# Patient Record
Sex: Male | Born: 1990 | Race: Black or African American | Hispanic: No | Marital: Single | Smoking: Never smoker
Health system: Southern US, Community
[De-identification: ages and names within clinical notes are randomized; demographics above are authoritative.]

## PROBLEM LIST (undated history)

## (undated) DIAGNOSIS — I341 Nonrheumatic mitral (valve) prolapse: Secondary | ICD-10-CM

## (undated) DIAGNOSIS — J45909 Unspecified asthma, uncomplicated: Secondary | ICD-10-CM

---

## 2010-08-24 ENCOUNTER — Emergency Department (HOSPITAL_COMMUNITY)
Admission: EM | Admit: 2010-08-24 | Discharge: 2010-08-24 | Disposition: A | Discharge status: Home / Self Care | Payer: 59 | Attending: Emergency Medicine | Admitting: Emergency Medicine

## 2010-08-24 DIAGNOSIS — S01119A Laceration without foreign body of unspecified eyelid and periocular area, initial encounter: Secondary | ICD-10-CM | POA: Insufficient documentation

## 2010-08-24 DIAGNOSIS — Y9367 Activity, basketball: Secondary | ICD-10-CM | POA: Insufficient documentation

## 2010-08-24 DIAGNOSIS — H5710 Ocular pain, unspecified eye: Secondary | ICD-10-CM | POA: Insufficient documentation

## 2010-08-24 DIAGNOSIS — W219XXA Striking against or struck by unspecified sports equipment, initial encounter: Secondary | ICD-10-CM | POA: Insufficient documentation

## 2010-08-24 DIAGNOSIS — M2560 Stiffness of unspecified joint, not elsewhere classified: Secondary | ICD-10-CM | POA: Insufficient documentation

## 2019-01-11 ENCOUNTER — Other Ambulatory Visit: Payer: Self-pay

## 2019-01-11 DIAGNOSIS — Z20822 Contact with and (suspected) exposure to covid-19: Secondary | ICD-10-CM

## 2019-01-12 LAB — NOVEL CORONAVIRUS, NAA: SARS-CoV-2, NAA: NOT DETECTED

## 2020-10-19 ENCOUNTER — Encounter (HOSPITAL_BASED_OUTPATIENT_CLINIC_OR_DEPARTMENT_OTHER): Payer: Self-pay | Admitting: Emergency Medicine

## 2020-10-19 ENCOUNTER — Emergency Department (HOSPITAL_BASED_OUTPATIENT_CLINIC_OR_DEPARTMENT_OTHER)
Admission: EM | Admit: 2020-10-19 | Discharge: 2020-10-19 | Disposition: A | Discharge status: Home / Self Care | Payer: Self-pay | Attending: Emergency Medicine | Admitting: Emergency Medicine

## 2020-10-19 ENCOUNTER — Other Ambulatory Visit: Payer: Self-pay

## 2020-10-19 ENCOUNTER — Emergency Department (HOSPITAL_BASED_OUTPATIENT_CLINIC_OR_DEPARTMENT_OTHER): Payer: Self-pay

## 2020-10-19 DIAGNOSIS — M25521 Pain in right elbow: Secondary | ICD-10-CM | POA: Insufficient documentation

## 2020-10-19 DIAGNOSIS — M7989 Other specified soft tissue disorders: Secondary | ICD-10-CM | POA: Insufficient documentation

## 2020-10-19 MED ORDER — NAPROXEN 500 MG PO TABS: 500.0000 mg | ORAL_TABLET | Freq: Two times a day (BID) | ORAL | 0 refills | Status: AC

## 2020-10-19 MED ORDER — OXYCODONE-ACETAMINOPHEN 5-325 MG PO TABS: 1.0000 | ORAL_TABLET | Freq: Four times a day (QID) | ORAL | 0 refills | Status: AC | PRN

## 2020-10-19 MED ORDER — LIDOCAINE 5 % EX PTCH: 1.0000 | MEDICATED_PATCH | CUTANEOUS | 0 refills | Status: AC

## 2020-10-19 NOTE — ED Notes (Signed)
Patient with swelling/pain to right elbow after bumping it this morning.

## 2020-10-19 NOTE — ED Triage Notes (Signed)
Reports pain in the right elbow since hitting it on a wall this morning.  Swelling noted.

## 2020-10-19 NOTE — ED Provider Notes (Signed)
[facility] EMERGENCY DEPARTMENT Provider Note   CSN: [id number] Arrival date & time: 10/19/20  1554     History Chief Complaint  Patient presents with   Elbow Pain    Steven Dickerson. is a 30 y.o. male here for evaluation of right elbow pain.  Hit his elbow on a glass panel earlier today.  Noted swelling to posterior aspect right elbow.  No overlying redness or warmth.  No paresthesias.  Pain worse with movement.  No bony tenderness to forearm, humerus.  No OTC meds PTA.  Pain a 6/10.  denies additional aggravating or alleviating factors.  History obtained from patient and past medical records.  No interpreter used  HPI     History reviewed. No pertinent past medical history.  There are no problems to display for this patient.   History reviewed. No pertinent surgical history.     No family history on file.     Home Medications Prior to Admission medications   Medication Sig Start Date End Date Taking? Authorizing Provider  lidocaine (LIDODERM) 5 % Place 1 patch onto the skin daily. Remove & Discard patch within 12 hours or as directed by MD 10/19/20  Yes Addilyn Satterwhite A, PA-C  naproxen (NAPROSYN) 500 MG tablet Take 1 tablet (500 mg total) by mouth 2 (two) times daily. 10/19/20  Yes Dewon Mendizabal A, PA-C  oxyCODONE-acetaminophen (PERCOCET/ROXICET) 5-325 MG tablet Take 1 tablet by mouth every 6 (six) hours as needed for severe pain. 10/19/20  Yes Green Quincy A, PA-C    Allergies    Patient has no allergy information on record.  Review of Systems   Review of Systems  Constitutional: Negative.   HENT: Negative.    Respiratory: Negative.    Cardiovascular: Negative.   Gastrointestinal: Negative.   Genitourinary: Negative.   Musculoskeletal:        Right elbow swelling, pain  Skin: Negative.   Neurological: Negative.   All other systems reviewed and are negative.  Physical Exam Updated Vital Signs BP (!) 120/98 (BP Location: Left Arm)    Pulse 63   Temp 98.4 F (36.9 C) (Oral)   Resp 18   Ht 6\' 3"  (1.905 m)   Wt 79.4 kg   SpO2 100%   BMI 21.87 kg/m   Physical Exam Vitals and nursing note reviewed.  Constitutional:      General: He is not in acute distress.    Appearance: He is well-developed. He is not ill-appearing, toxic-appearing or diaphoretic.  HENT:     Head: Normocephalic and atraumatic.  Eyes:     Pupils: Pupils are equal, round, and reactive to light.  Cardiovascular:     Rate and Rhythm: Normal rate and regular rhythm.  Pulmonary:     Effort: Pulmonary effort is normal. No respiratory distress.  Abdominal:     General: There is no distension.     Palpations: Abdomen is soft.  Musculoskeletal:        General: Swelling, tenderness and signs of injury present. Normal range of motion.     Cervical back: Normal range of motion and neck supple.     Comments: Tenderness with moderate soft tissue swelling to posterior aspect right olecranon.  No bony tenderness to humerus, forearm.  No redness, warmth.  Skin:    General: Skin is warm and dry.     Capillary Refill: Capillary refill takes less than 2 seconds.  Neurological:     General: No focal deficit present.  Mental Status: He is alert and oriented to person, place, and time.     Comments: Intact sensation    ED Results / Procedures / Treatments   Labs (all labs ordered are listed, but only abnormal results are displayed) Labs Reviewed - No data to display  EKG None  Radiology DG Elbow Complete Right  Result Date: 10/19/2020 CLINICAL DATA:  Pain after injury EXAM: RIGHT ELBOW - COMPLETE 3+ VIEW COMPARISON:  None. FINDINGS: Lateral view limited by positioning. No fracture or malalignment. No obvious elbow effusion. Soft tissue swelling over the posterior elbow IMPRESSION: Posterior soft tissue swelling.  No acute osseous abnormality Electronically Signed   By: Jasmine Pang M.D.   On: 10/19/2020 16:54    Procedures .Ortho Injury  Treatment  Date/Time: 10/19/2020 6:00 PM Performed by: Ralph Leyden A, PA-C Authorized by: Linwood Dibbles, PA-C   Consent:    Consent obtained:  Verbal   Consent given by:  Patient   Risks discussed:  Fracture, nerve damage, restricted joint movement, vascular damage, stiffness, recurrent dislocation and irreducible dislocation   Alternatives discussed:  No treatment, alternative treatment, immobilization, referral and delayed treatmentInjury location: elbow Location details: right elbow Injury type: soft tissue Pre-procedure neurovascular assessment: neurovascularly intact Pre-procedure distal perfusion: normal Pre-procedure neurological function: normal Pre-procedure range of motion: normal  Anesthesia: Local anesthesia used: no  Patient sedated: NoImmobilization: sling Splint Applied by: ED Tech Post-procedure neurovascular assessment: post-procedure neurovascularly intact Post-procedure distal perfusion: normal Post-procedure neurological function: normal Post-procedure range of motion: normal     Medications Ordered in ED Medications - No data to display  ED Course  I have reviewed the triage vital signs and the nursing notes.  Pertinent labs & imaging results that were available during my care of the patient were reviewed by me and considered in my medical decision making (see chart for details).  Here for evaluation of right elbow pain after hitting it on a glass window earlier today.  No lacerations or abrasions.  He is afebrile, nonseptic, not ill-appearing.  He is neurovascularly intact.  He has moderate posterior olecranon swelling however no redness or warmth.    X-ray shows no fracture or dislocation  Patient likely with traumatic bursitis  Low suspicion for septic joint, gout, hemarthrosis, VTE, ischemia, occult fracture.  Placed in sling for comfort, discussed anti-inflammatories, ice, follow with orthopedics if symptoms not resolved.  Patient  agreeable  The patient has been appropriately medically screened and/or stabilized in the ED. I have low suspicion for any other emergent medical condition which would require further screening, evaluation or treatment in the ED or require inpatient management.  Patient is hemodynamically stable and in no acute distress.  Patient able to ambulate in department prior to ED.  Evaluation does not show acute pathology that would require ongoing or additional emergent interventions while in the emergency department or further inpatient treatment.  I have discussed the diagnosis with the patient and answered all questions.  Pain is been managed while in the emergency department and patient has no further complaints prior to discharge.  Patient is comfortable with plan discussed in room and is stable for discharge at this time.  I have discussed strict return precautions for returning to the emergency department.  Patient was encouraged to follow-up with PCP/specialist refer to at discharge.     MDM Rules/Calculators/A&P  Final Clinical Impression(s) / ED Diagnoses Final diagnoses:  Right elbow pain    Rx / DC Orders ED Discharge Orders          Ordered    naproxen (NAPROSYN) 500 MG tablet  2 times daily        10/19/20 1758    oxyCODONE-acetaminophen (PERCOCET/ROXICET) 5-325 MG tablet  Every 6 hours PRN        10/19/20 1758    lidocaine (LIDODERM) 5 %  Every 24 hours        10/19/20 1758             Vona Whiters A, PA-C 10/19/20 1801    Arby Barrette, MD 11/17/20 [phone]

## 2020-10-19 NOTE — Discharge Instructions (Addendum)
Take the Percocet as needed for pain  Take the naproxen for anti-inflammatory purposes Use lidocaine patches on your elbow for pain management  Keep elbow in sling over the next 2 days.  After that she may use some gentle stretching exercises.  If symptoms do not improve With orthopedics.  I have listed on your discharge paperwork.  You will need to call to schedule an appointment

## 2022-02-20 ENCOUNTER — Other Ambulatory Visit: Payer: Self-pay

## 2022-02-20 ENCOUNTER — Encounter (HOSPITAL_BASED_OUTPATIENT_CLINIC_OR_DEPARTMENT_OTHER): Payer: Self-pay | Admitting: Urology

## 2022-02-20 ENCOUNTER — Emergency Department (HOSPITAL_BASED_OUTPATIENT_CLINIC_OR_DEPARTMENT_OTHER)
Admission: EM | Admit: 2022-02-20 | Discharge: 2022-02-20 | Disposition: A | Discharge status: Home / Self Care | Payer: Managed Care, Other (non HMO) | Attending: Emergency Medicine | Admitting: Emergency Medicine

## 2022-02-20 ENCOUNTER — Emergency Department (HOSPITAL_BASED_OUTPATIENT_CLINIC_OR_DEPARTMENT_OTHER): Payer: Managed Care, Other (non HMO)

## 2022-02-20 DIAGNOSIS — J4521 Mild intermittent asthma with (acute) exacerbation: Secondary | ICD-10-CM | POA: Insufficient documentation

## 2022-02-20 DIAGNOSIS — R0602 Shortness of breath: Secondary | ICD-10-CM | POA: Diagnosis present

## 2022-02-20 HISTORY — DX: Unspecified asthma, uncomplicated: J45.909

## 2022-02-20 HISTORY — DX: Nonrheumatic mitral (valve) prolapse: I34.1

## 2022-02-20 MED ORDER — PREDNISONE 50 MG PO TABS
60.0000 mg | ORAL_TABLET | Freq: Once | ORAL | Status: AC
  Administered 2022-02-20: 60 mg via ORAL
  Filled 2022-02-20: qty 1

## 2022-02-20 MED ORDER — ALBUTEROL SULFATE HFA 108 (90 BASE) MCG/ACT IN AERS
2.0000 | INHALATION_SPRAY | Freq: Once | RESPIRATORY_TRACT | Status: AC
  Administered 2022-02-20: 2 via RESPIRATORY_TRACT
  Filled 2022-02-20: qty 6.7

## 2022-02-20 MED ORDER — ALBUTEROL SULFATE HFA 108 (90 BASE) MCG/ACT IN AERS: 2.0000 | INHALATION_SPRAY | RESPIRATORY_TRACT | 0 refills | Status: AC | PRN

## 2022-02-20 MED ORDER — PREDNISONE 10 MG PO TABS: 40.0000 mg | ORAL_TABLET | Freq: Every day | ORAL | 0 refills | Status: AC

## 2022-02-20 NOTE — ED Provider Notes (Signed)
[facility] EMERGENCY DEPARTMENT Provider Note   CSN: [id number] Arrival date & time: 02/20/22  1845     History  Chief Complaint  Patient presents with   Shortness of Breath    Steven Dethomas. is a 31 y.o. male.  Patient is a 31 year old male with a past medical history of asthma presenting to the emergency department with shortness of breath.  Patient states that he tends to have allergy symptoms with weather changes and over the last few days with the weather changing from the 60s to the 40s he has had increasing shortness of breath.  He denies any chest pain or cough, fevers or chills, nausea or vomiting.  He denies any lower extremity swelling.  He states that he has not needed an inhaler in several years and so did not have 1 at home to take for his symptoms.  He states that he has been taking his allergy medications without significant relief.  The history is provided by the patient.  Shortness of Breath      Home Medications Prior to Admission medications   Medication Sig Start Date End Date Taking? Authorizing Provider  albuterol (VENTOLIN HFA) 108 (90 Base) MCG/ACT inhaler Inhale 2 puffs into the lungs every 4 (four) hours as needed for wheezing or shortness of breath. 02/20/22  Yes Maylon Peppers, [place] K, DO  predniSONE (DELTASONE) 10 MG tablet Take 4 tablets (40 mg total) by mouth daily for 4 days. 02/20/22 02/24/22 Yes Kingsley, Jordan Hawks K, DO  lidocaine (LIDODERM) 5 % Place 1 patch onto the skin daily. Remove & Discard patch within 12 hours or as directed by MD 10/19/20   Henderly, Britni A, PA-C  naproxen (NAPROSYN) 500 MG tablet Take 1 tablet (500 mg total) by mouth 2 (two) times daily. 10/19/20   Henderly, Britni A, PA-C  oxyCODONE-acetaminophen (PERCOCET/ROXICET) 5-325 MG tablet Take 1 tablet by mouth every 6 (six) hours as needed for severe pain. 10/19/20   Henderly, Britni A, PA-C      Allergies    Patient has no known allergies.    Review of Systems    Review of Systems  Respiratory:  Positive for shortness of breath.     Physical Exam Updated Vital Signs BP (!) 136/95   Pulse 74   Temp 98 F (36.7 C) (Oral)   Resp 16   Ht 6\' 3"  (1.905 m)   Wt 79.4 kg   SpO2 95%   BMI 21.88 kg/m  Physical Exam Vitals and nursing note reviewed.  Constitutional:      General: He is not in acute distress.    Appearance: He is well-developed.  HENT:     Head: Normocephalic and atraumatic.     Mouth/Throat:     Mouth: Mucous membranes are moist.     Pharynx: Oropharynx is clear.  Eyes:     Extraocular Movements: Extraocular movements intact.  Cardiovascular:     Rate and Rhythm: Normal rate and regular rhythm.     Heart sounds: Normal heart sounds.  Pulmonary:     Effort: Pulmonary effort is normal.     Breath sounds: Normal breath sounds. Wheezes: Trace inspiratory and expiratory in diffuse lung fields.  Abdominal:     Palpations: Abdomen is soft.     Tenderness: There is no abdominal tenderness.  Musculoskeletal:        General: Normal range of motion.     Cervical back: Normal range of motion and neck supple.  Skin:  General: Skin is warm and dry.  Neurological:     General: No focal deficit present.     Mental Status: He is alert and oriented to person, place, and time.  Psychiatric:        Mood and Affect: Mood normal.        Behavior: Behavior normal.     ED Results / Procedures / Treatments   Labs (all labs ordered are listed, but only abnormal results are displayed) Labs Reviewed - No data to display  EKG None  Radiology DG Chest 2 View  Result Date: 02/20/2022 CLINICAL DATA:  Shortness of breath EXAM: CHEST - 2 VIEW COMPARISON:  Chest x-ray 08/13/2012 FINDINGS: The heart size and mediastinal contours are within normal limits. Both lungs are clear. The visualized skeletal structures are unremarkable. IMPRESSION: No active cardiopulmonary disease. Electronically Signed   By: Ronney Asters M.D.   On: 02/20/2022  19:04    Procedures Procedures    Medications Ordered in ED Medications  albuterol (VENTOLIN HFA) 108 (90 Base) MCG/ACT inhaler 2 puff (2 puffs Inhalation Given 02/20/22 2003)  predniSONE (DELTASONE) tablet 60 mg (60 mg Oral Given 02/20/22 2000)    ED Course/ Medical Decision Making/ A&P                           Medical Decision Making This patient presents to the ED with chief complaint(s) of shortness of breath with pertinent past medical history of asthma which further complicates the presenting complaint. The complaint involves an extensive differential diagnosis and also carries with it a high risk of complications and morbidity.    The differential diagnosis includes patient does have some mild wheezing on exam, consistent with asthma exacerbation, he has had no fever or cough making pneumonia or viral infection less likely, he has no evidence of respiratory distress or failure  Additional history obtained: Additional history obtained from N/A Records reviewed N/A  ED Course and Reassessment: Upon patient's arrival to the emergency department he has some mild inspiratory and expiratory wheeze on exam and will be treated with albuterol and prednisone.  Chest x-ray was performed that showed no acute disease.  He had improvement of his symptoms after treatment and is stable for discharge home with primary care follow-up.  He was given strict return precautions.  Independent labs interpretation:  N/A  Independent visualization of imaging: - I independently visualized the following imaging with scope of interpretation limited to determining acute life threatening conditions related to emergency care: CXR, which revealed No acute disease  Consultation: - Consulted or discussed management/test interpretation w/ external professional: N/A  Consideration for admission or further workup: Patient has no emergent conditions requiring admission or further work-up at this time and is  stable for discharge home with primary care follow-up  Social Determinants of health: N/A    Amount and/or Complexity of Data Reviewed Radiology: ordered.  Risk Prescription drug management.          Final Clinical Impression(s) / ED Diagnoses Final diagnoses:  Mild intermittent asthma with exacerbation    Rx / DC Orders ED Discharge Orders          Ordered    predniSONE (DELTASONE) 10 MG tablet  Daily        02/20/22 2128    albuterol (VENTOLIN HFA) 108 (90 Base) MCG/ACT inhaler  Every 4 hours PRN        02/20/22 2128  Rexford Maus, DO 02/20/22 2135

## 2022-02-20 NOTE — Discharge Instructions (Signed)
You are seen in the emergency department for your shortness of breath, you had some mild wheezing on your exam consistent with an asthma exacerbation.  Your chest x-ray showed no signs of pneumonia.  You have given you steroids and albuterol inhaler.  You should take the steroids as prescribed over the next 4 days and use the albuterol as needed.  He can follow-up with your primary doctor to have your symptoms rechecked.  You should return to the emergency department for worsening shortness of breath, chest pain or if you have any other new or concerning symptoms.

## 2022-02-20 NOTE — ED Triage Notes (Signed)
Pt states has been sob x past 3 days H/o asthma and states with the warmer weather has been feeling more short of breath  Denies cough, denies fever

## 2022-03-18 ENCOUNTER — Ambulatory Visit: Payer: Managed Care, Other (non HMO) | Admitting: Family Medicine

## 2022-03-18 ENCOUNTER — Encounter: Payer: Self-pay | Admitting: Family Medicine

## 2022-03-18 VITALS — BP 135/87 | HR 56 | Temp 97.2°F | Ht 75.0 in | Wt 169.0 lb

## 2022-03-18 DIAGNOSIS — I341 Nonrheumatic mitral (valve) prolapse: Secondary | ICD-10-CM

## 2022-03-18 DIAGNOSIS — J4521 Mild intermittent asthma with (acute) exacerbation: Secondary | ICD-10-CM

## 2022-03-18 NOTE — Progress Notes (Unsigned)
   New Patient Office Visit  Subjective    Patient ID: Steven Lazaro., male    DOB: 1990-06-21  Age: 32 y.o. MRN: [id number]  CC:  Chief Complaint  Patient presents with   Asthma    New pt. Seen in ER for Asthmatic Bronchitis, needs work restriction note.    HPI Steven Dickerson. presents to establish care ***  Outpatient Encounter Medications as of 03/18/2022  Medication Sig   albuterol (VENTOLIN HFA) 108 (90 Base) MCG/ACT inhaler Inhale 2 puffs into the lungs every 4 (four) hours as needed for wheezing or shortness of breath.   lidocaine (LIDODERM) 5 % Place 1 patch onto the skin daily. Remove & Discard patch within 12 hours or as directed by MD   naproxen (NAPROSYN) 500 MG tablet Take 1 tablet (500 mg total) by mouth 2 (two) times daily.   oxyCODONE-acetaminophen (PERCOCET/ROXICET) 5-325 MG tablet Take 1 tablet by mouth every 6 (six) hours as needed for severe pain.   No facility-administered encounter medications on file as of 03/18/2022.    Past Medical History:  Diagnosis Date   Asthma    Mitral valve prolapse     History reviewed. No pertinent surgical history.  History reviewed. No pertinent family history.  Social History   Socioeconomic History   Marital status: Single    Spouse name: Not on file   Number of children: Not on file   Years of education: Not on file   Highest education level: Not on file  Occupational History   Not on file  Tobacco Use   Smoking status: Never   Smokeless tobacco: Never  Substance and Sexual Activity   Alcohol use: Yes    Comment: rarely   Drug use: Never   Sexual activity: Not on file  Other Topics Concern   Not on file  Social History Narrative   Not on file   Social Determinants of Health   Financial Resource Strain: Not on file  Food Insecurity: Not on file  Transportation Needs: Not on file  Physical Activity: Not on file  Stress: Not on file  Social Connections: Not on file  Intimate Partner Violence:  Not on file    ROS      Objective    BP 135/87   Pulse (!) 56   Temp (!) 97.2 F (36.2 C)   Ht 6\' 3"  (1.905 m)   Wt 169 lb (76.7 kg)   SpO2 95%   BMI 21.12 kg/m   Physical Exam  {Labs (Optional):23779}    Assessment & Plan:   Problem List Items Addressed This Visit   None Visit Diagnoses     Mild intermittent asthma with acute exacerbation    -  Primary   Mitral valve prolapse           Return in about 2 weeks (around 04/01/2022) for PFTS.   Elwin Mocha, MD

## 2022-03-18 NOTE — Progress Notes (Unsigned)
Pt. Was seen in ER and given Albuterol Inhaler and Prednisone. Pt. Needs a work restriction note so he does not have to go into freezer at work.

## 2022-04-01 ENCOUNTER — Other Ambulatory Visit: Payer: Managed Care, Other (non HMO) | Admitting: Family Medicine

## 2022-04-01 ENCOUNTER — Ambulatory Visit: Payer: Managed Care, Other (non HMO)

## 2022-07-09 ENCOUNTER — Emergency Department (HOSPITAL_COMMUNITY): Payer: Managed Care, Other (non HMO)

## 2022-07-09 ENCOUNTER — Other Ambulatory Visit: Payer: Self-pay

## 2022-07-09 ENCOUNTER — Ambulatory Visit: Payer: Managed Care, Other (non HMO) | Admitting: Family Medicine

## 2022-07-09 ENCOUNTER — Emergency Department (HOSPITAL_COMMUNITY)
Admission: EM | Admit: 2022-07-09 | Discharge: 2022-07-09 | Disposition: A | Discharge status: Home / Self Care | Payer: Managed Care, Other (non HMO) | Attending: Emergency Medicine | Admitting: Emergency Medicine

## 2022-07-09 ENCOUNTER — Encounter (HOSPITAL_COMMUNITY): Payer: Self-pay | Admitting: Emergency Medicine

## 2022-07-09 DIAGNOSIS — H9202 Otalgia, left ear: Secondary | ICD-10-CM | POA: Diagnosis present

## 2022-07-09 DIAGNOSIS — R6884 Jaw pain: Secondary | ICD-10-CM | POA: Diagnosis not present

## 2022-07-09 DIAGNOSIS — I890 Lymphedema, not elsewhere classified: Secondary | ICD-10-CM | POA: Insufficient documentation

## 2022-07-09 DIAGNOSIS — R0602 Shortness of breath: Secondary | ICD-10-CM | POA: Diagnosis not present

## 2022-07-09 DIAGNOSIS — J45909 Unspecified asthma, uncomplicated: Secondary | ICD-10-CM | POA: Diagnosis not present

## 2022-07-09 LAB — BASIC METABOLIC PANEL
Anion gap: 10 (ref 5–15)
BUN: 7 mg/dL (ref 6–20)
CO2: 26 mmol/L (ref 22–32)
Calcium: 9.2 mg/dL (ref 8.9–10.3)
Chloride: 99 mmol/L (ref 98–111)
Creatinine, Ser: 0.99 mg/dL (ref 0.61–1.24)
GFR, Estimated: >60 mL/min (ref >60)
Glucose, Bld: 101 mg/dL — ABNORMAL HIGH (ref 70–99)
Potassium: 3.9 mmol/L (ref 3.5–5.1)
Sodium: 135 mmol/L (ref 135–145)

## 2022-07-09 LAB — CBC WITH DIFFERENTIAL/PLATELET
Abs Immature Granulocytes: 0.02 10*3/uL (ref 0.00–0.07)
Basophils Absolute: 0.1 10*3/uL (ref 0.0–0.1)
Basophils Relative: 1 %
Eosinophils Absolute: 0.7 10*3/uL — ABNORMAL HIGH (ref 0.0–0.5)
Eosinophils Relative: 10 %
HCT: 43.7 % (ref 39.0–52.0)
Hemoglobin: 14.6 g/dL (ref 13.0–17.0)
Immature Granulocytes: 0 %
Lymphocytes Relative: 21 %
Lymphs Abs: 1.6 10*3/uL (ref 0.7–4.0)
MCH: 30.4 pg (ref 26.0–34.0)
MCHC: 33.4 g/dL (ref 30.0–36.0)
MCV: 91 fL (ref 80.0–100.0)
Monocytes Absolute: 0.8 10*3/uL (ref 0.1–1.0)
Monocytes Relative: 10 %
Neutro Abs: 4.5 10*3/uL (ref 1.7–7.7)
Neutrophils Relative %: 58 %
Platelets: 341 10*3/uL (ref 150–400)
RBC: 4.8 MIL/uL (ref 4.22–5.81)
RDW: 13.1 % (ref 11.5–15.5)
WBC: 7.7 10*3/uL (ref 4.0–10.5)
nRBC: 0 % (ref 0.0–0.2)

## 2022-07-09 NOTE — ED Triage Notes (Addendum)
Pt c/o left ear fullness and pain that goes to jaw and left neck after laying on his side sleeping. Pt also reports bump on left neck/shoulder that he noticed tonight. Reports he has been struggling with allergies and congestion recently.

## 2022-07-09 NOTE — ED Provider Notes (Signed)
[facility] AT [facility] Provider Note   CSN: [id number] Arrival date & time: 07/09/22  4098     History  Chief Complaint  Patient presents with   Otalgia    Steven All. is a 32 y.o. male.  HPI  Patient with medical history including asthma, mitral prolapse, presenting with complaints of left-sided ear pain and jaw pain which started yesterday, states that having difficulty with his allergies, has continuous nasal congestion, states that he laid down to go to bed and then woke up with pain in his left ear, states he feels some fullness and then feels pain in his left jaw, denies any change in hearing, denies any tongue throat lip swelling.  States he is have some difficulty breathing but this has been going on for last 4 months, states it remained unchanged, denies any pleuritic chest pain, recurrent chest pain at this time, no history of PEs or DVTs, no cardiac history, no recent surgeries no long mobilization, no family history of connective tissue disorder, autoimmune diseases.    Home Medications Prior to Admission medications   Medication Sig Start Date End Date Taking? Authorizing Provider  albuterol (VENTOLIN HFA) 108 (90 Base) MCG/ACT inhaler Inhale 2 puffs into the lungs every 4 (four) hours as needed for wheezing or shortness of breath. 02/20/22   Elayne Snare K, DO  lidocaine (LIDODERM) 5 % Place 1 patch onto the skin daily. Remove & Discard patch within 12 hours or as directed by MD 10/19/20   Henderly, Britni A, PA-C  naproxen (NAPROSYN) 500 MG tablet Take 1 tablet (500 mg total) by mouth 2 (two) times daily. 10/19/20   Henderly, Britni A, PA-C  oxyCODONE-acetaminophen (PERCOCET/ROXICET) 5-325 MG tablet Take 1 tablet by mouth every 6 (six) hours as needed for severe pain. 10/19/20   Henderly, Britni A, PA-C      Allergies    Patient has no known allergies.    Review of Systems   Review of Systems  Constitutional:  Negative for  chills and fever.  HENT:  Positive for congestion.   Respiratory:  Negative for shortness of breath.   Cardiovascular:  Negative for chest pain.  Gastrointestinal:  Negative for abdominal pain.  Neurological:  Negative for headaches.    Physical Exam Updated Vital Signs BP (!) 142/91 (BP Location: Right Arm)   Pulse 65   Temp 98.3 F (36.8 C)   Resp 16   Ht 6\' 3"  (1.905 m)   Wt 76.7 kg   SpO2 99%   BMI 21.14 kg/m  Physical Exam Vitals and nursing note reviewed.  Constitutional:      General: He is not in acute distress.    Appearance: He is not ill-appearing.  HENT:     Head: Normocephalic and atraumatic.     Right Ear: Tympanic membrane, ear canal and external ear normal.     Left Ear: Tympanic membrane, ear canal and external ear normal.     Ears:     Comments: No ear protrusion, no tenderness behind the mastoids, left ear canal was patent, no evidence of infection, TM intact, slightly retracted without effusions present, right ear canal was patent, no evidence of infection, TM intact, nonbulging, no erythema, no effusions present    Nose: Congestion present.     Mouth/Throat:     Mouth: Mucous membranes are moist.     Pharynx: Oropharynx is clear. No oropharyngeal exudate or posterior oropharyngeal erythema.     Comments:  No trismus no torticollis, tongue uvula both midline, controlling oral secretions, tonsils are both equal symmetric bilaterally, no submandibular swelling, no obvious dental cavity or evidence of gingivitis.  No muffled tone voice Eyes:     Conjunctiva/sclera: Conjunctivae normal.  Neck:     Comments: Palpable left subclavian lymph node present, no other lymph nodes present on patient's neck. Cardiovascular:     Rate and Rhythm: Normal rate and regular rhythm.     Pulses: Normal pulses.     Heart sounds: No murmur heard.    No friction rub. No gallop.  Pulmonary:     Effort: No respiratory distress.     Breath sounds: No wheezing, rhonchi or rales.   Musculoskeletal:     Right lower leg: No edema.     Left lower leg: No edema.     Comments: No leg swelling no calf tenderness no palpable cords.  Skin:    General: Skin is warm and dry.  Neurological:     Mental Status: He is alert.  Psychiatric:        Mood and Affect: Mood normal.     ED Results / Procedures / Treatments   Labs (all labs ordered are listed, but only abnormal results are displayed) Labs Reviewed  BASIC METABOLIC PANEL - Abnormal; Notable for the following components:      Result Value   Glucose, Bld 101 (*)    All other components within normal limits  CBC WITH DIFFERENTIAL/PLATELET - Abnormal; Notable for the following components:   Eosinophils Absolute 0.7 (*)    All other components within normal limits    EKG None  Radiology DG Chest Portable 1 View  Result Date: 07/09/2022 CLINICAL DATA:  Cough.  Chest pain. EXAM: PORTABLE CHEST 1 VIEW COMPARISON:  02/20/2022 FINDINGS: The heart size and mediastinal contours are within normal limits. Both lungs are clear. The visualized skeletal structures are unremarkable. IMPRESSION: No active disease. Electronically Signed   By: Kennith Center M.D.   On: 07/09/2022 05:34    Procedures Procedures    Medications Ordered in ED Medications - No data to display  ED Course/ Medical Decision Making/ A&P                             Medical Decision Making Amount and/or Complexity of Data Reviewed Labs: ordered. Radiology: ordered.   This patient presents to the ED for concern of left ear and neck pain, this involves an extensive number of treatment options, and is a complaint that carries with it a high risk of complications and morbidity.  The differential diagnosis includes otitis media, otitis externa, retropharyngeal abscess    Additional history obtained:  Additional history obtained from N/A External records from outside source obtained and reviewed including recent ER notes   Co morbidities that  complicate the patient evaluation  Asthma  Social Determinants of Health:  N/A    Lab Tests:  I Ordered, and personally interpreted labs.  The pertinent results include: CBC unremarkable, BMP shows glucose of 101   Imaging Studies ordered:  I ordered imaging studies including chest x-ray I independently visualized and interpreted imaging which showed acute findings I agree with the radiologist interpretation   Cardiac Monitoring:  The patient was maintained on a cardiac monitor.  I personally viewed and interpreted the cardiac monitored which showed an underlying rhythm of: EKG without signs of ischemia   Medicines ordered and prescription drug management:  I ordered medication including N/A I have reviewed the patients home medicines and have made adjustments as needed  Critical Interventions:  N/A   Reevaluation:  Presents with concerns of left neck and ear pain, my exam patient had retracted left TM, patient is nasal congestion, palpable left subclavian lymph node, I suspect viral etiology as well as allergies, but will obtain basic lab work to rule out possible lymphoma as a chest x-ray due to a cough.  Patient was reassessed resting comfortably agreeable discharge at this time.  Consultations Obtained:  N/a    Test Considered:  N/a    Rule out Low suspicion for systemic infection as patient is nontoxic-appearing, vital signs reassuring, no obvious source infection noted on exam.  Low suspicion for pneumonia as lung sounds are clear bilaterally, x-ray did not reveal any acute findings.  I have low suspicion for PE as patient denies pleuritic chest pain, shortness of breath, patient is PERC.  Suspicion for ACS is also low at this time she has no cardiac history, EKG without signs of ischemia.  I doubt carotid dissection as presentation atypical, patient has no risk factors, presentation appears to be more consistent with likely eustachian tube dysfunction  from allergies/URI. low suspicion for strep pharyngitis, Ludwig angina, retropharyngeal/peritonsillar abscess as oropharynx was visualized, no erythema or exudates noted, no submandibular swelling, no muffled tone voice.  Suspicion for lymphoma is also low at this time CBC is within normal limits, did not feel other enlarged lymph nodes during my examination.  Doubt otitis media or externa no evidence of infection during my examination    Dispostion and problem list  After consideration of the diagnostic results and the patients response to treatment, I feel that the patent would benefit from discharge.  Left ear pain-suspect eustachian tube dysfunction from allergies, will have him continue with Zyrtec's, will add on Flonase, have him follow-up with ENT for further evaluation given strict return precautions. Lymphedema-enlarged left cervical lymph node, patient was made aware this, suspect this is reactionary from URI, will have him follow-up with his PCP for further assessment            Final Clinical Impression(s) / ED Diagnoses Final diagnoses:  Left ear pain  Lymphedema    Rx / DC Orders ED Discharge Orders     None         Carroll Sage, PA-C 07/09/22 1610    Marily Memos, MD 07/09/22 [phone]

## 2022-07-09 NOTE — Discharge Instructions (Signed)
Likely your ear pain is from eustachian tube dysfunction, please continue with Zyrtec and/or Claritin, I recommend adding on Flonase this will help with congestion.  I recommend follow-up with ENT for further evaluation given contact number for further evaluation Lymphedema-likely the lymph node in your neck is reactionary from the allergies, I recommend follow-up with your primary doctor for further evaluation.  Come back to the emergency department if you develop chest pain, shortness of breath, severe abdominal pain, uncontrolled nausea, vomiting, diarrhea.

## 2022-09-24 ENCOUNTER — Other Ambulatory Visit: Payer: Self-pay | Admitting: Family Medicine

## 2022-09-24 DIAGNOSIS — R0600 Dyspnea, unspecified: Secondary | ICD-10-CM

## 2022-09-24 DIAGNOSIS — R079 Chest pain, unspecified: Secondary | ICD-10-CM

## 2022-10-20 ENCOUNTER — Ambulatory Visit: Payer: Managed Care, Other (non HMO)

## 2022-10-20 ENCOUNTER — Other Ambulatory Visit: Payer: Managed Care, Other (non HMO)

## 2022-10-20 DIAGNOSIS — R079 Chest pain, unspecified: Secondary | ICD-10-CM

## 2022-10-20 DIAGNOSIS — R0600 Dyspnea, unspecified: Secondary | ICD-10-CM

## 2022-10-21 NOTE — Progress Notes (Signed)
Echocardiogram 10/20/2022: Left ventricle cavity is normal in size. Normal left ventricular wall thickness. Normal global wall motion. Normal LV systolic function with visual EF 50-55%. Normal diastolic filling pattern.  Mild tricuspid regurgitation.  No evidence of pulmonary hypertension.

## 2022-12-17 IMAGING — DX DG ELBOW COMPLETE 3+V*R*
4 series · 4 of 4 positions shown · non-contrast
Comparison: None.

CLINICAL DATA: Pain after injury

EXAM:
RIGHT ELBOW - COMPLETE 3+ VIEW

[elbow ap]
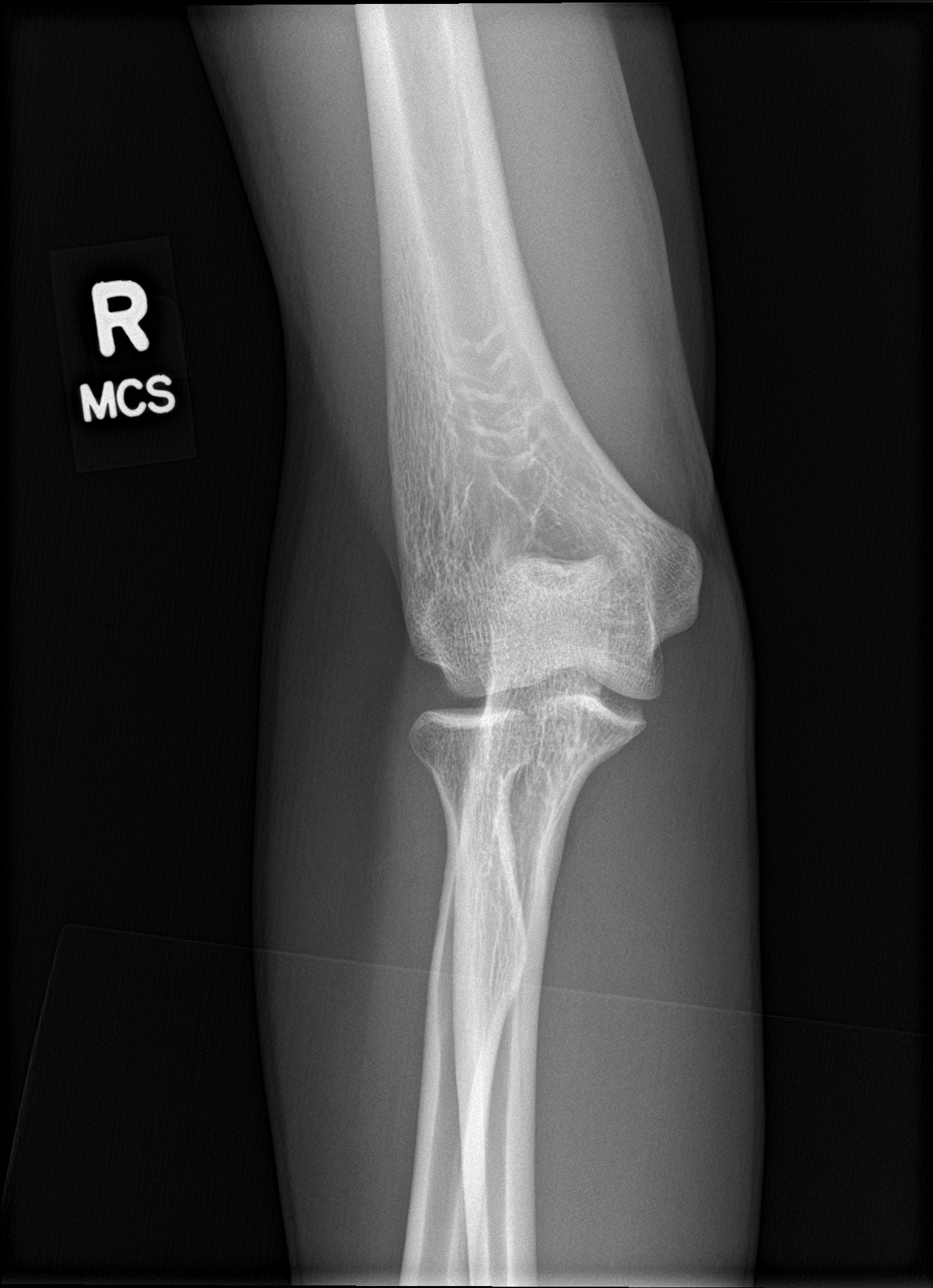

[elbow obl (1 of 2)]
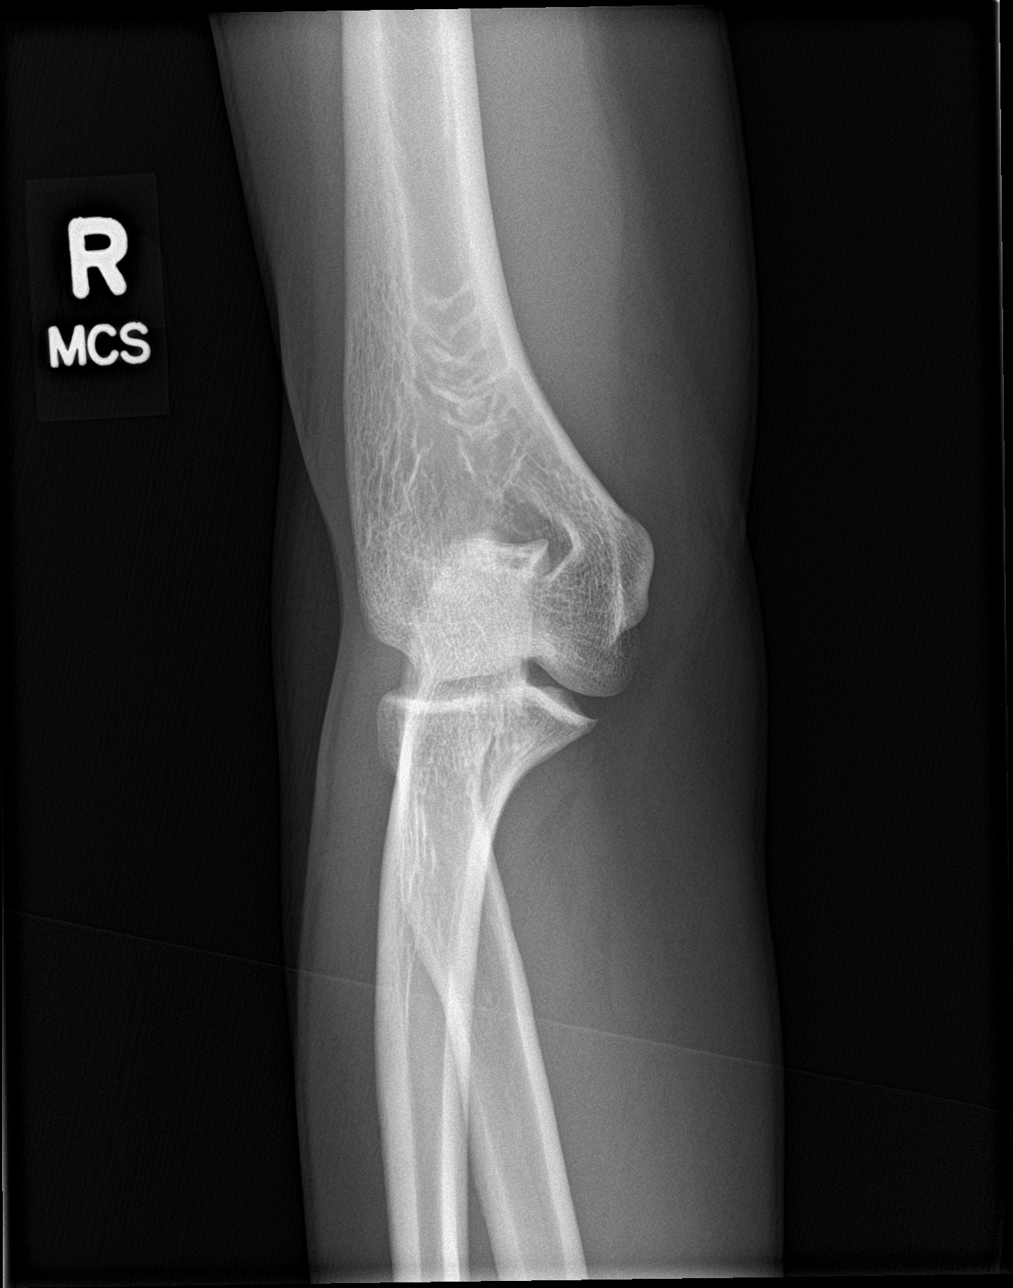

[elbow obl (2 of 2)]
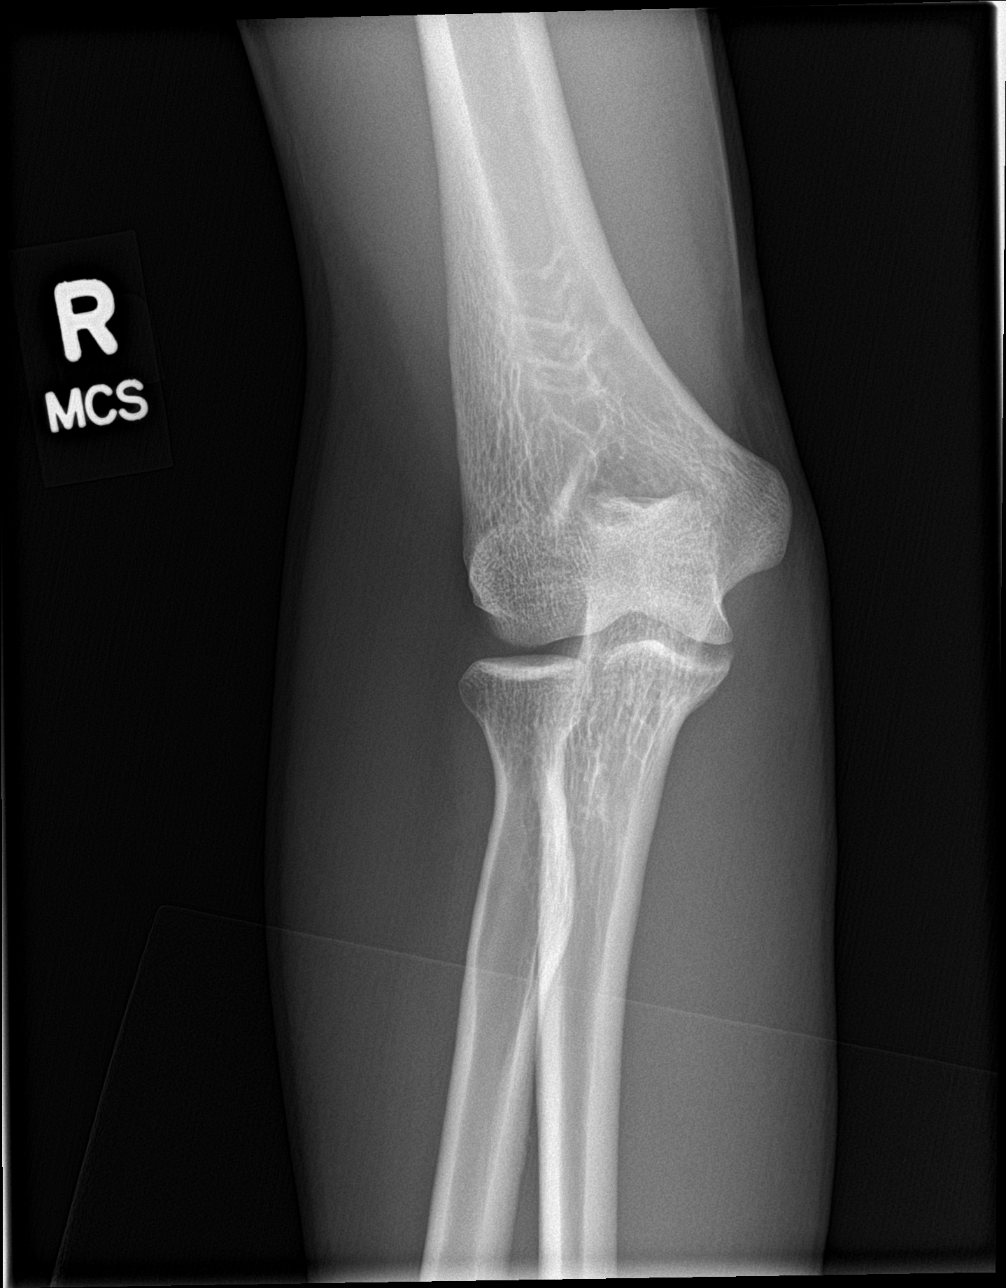

[elbow lat]
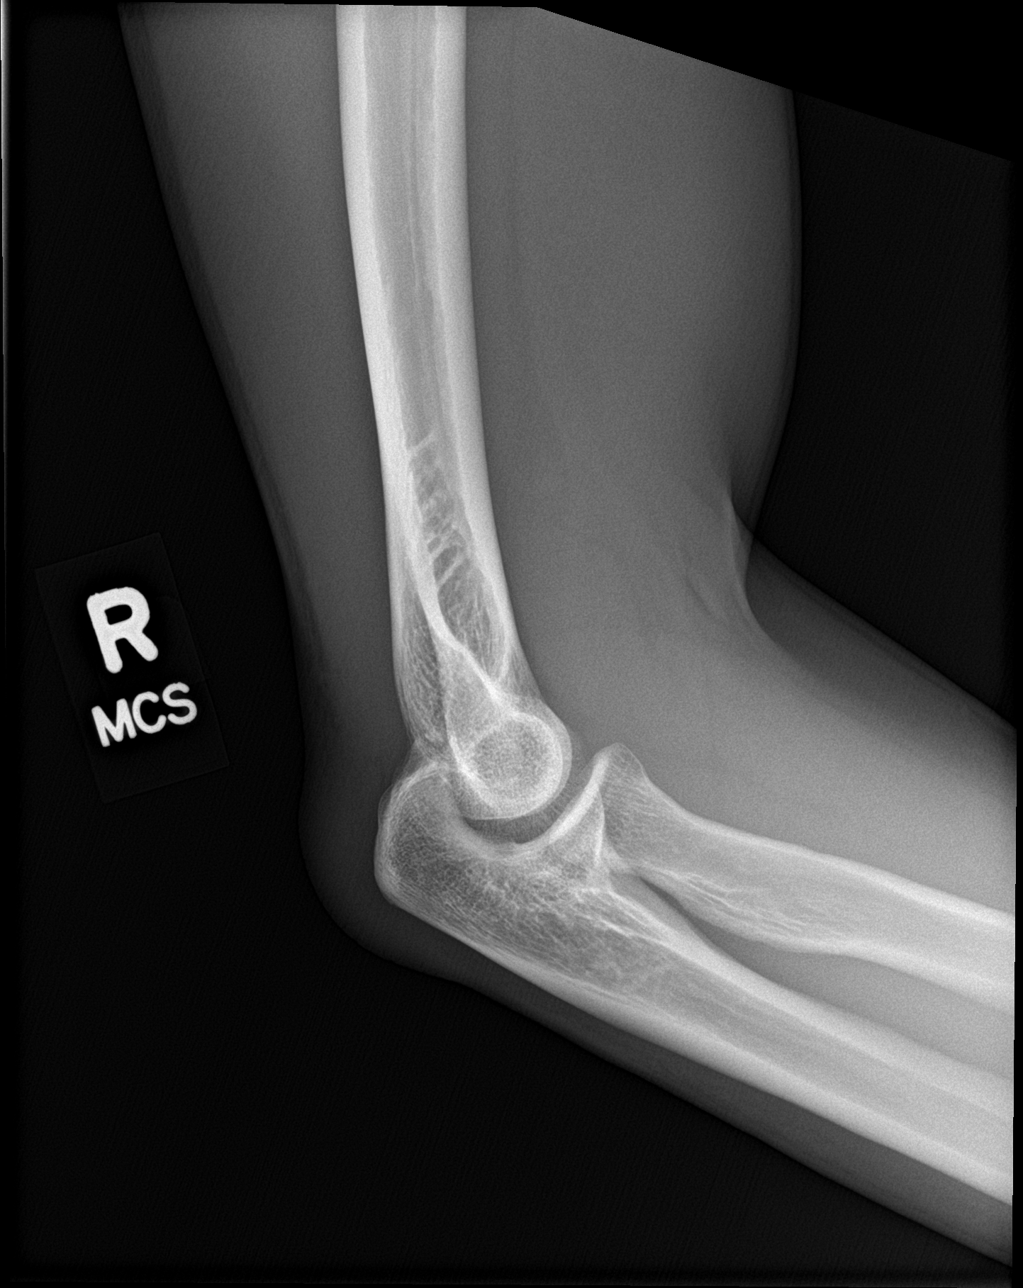

[4 of 4 positions shown; findings below may reference images not displayed]

FINDINGS: Lateral view limited by positioning. No fracture or malalignment. No
obvious elbow effusion. Soft tissue swelling over the posterior
elbow
IMPRESSION: Posterior soft tissue swelling.  No acute osseous abnormality

## 2023-01-08 ENCOUNTER — Other Ambulatory Visit: Payer: Self-pay

## 2023-01-09 LAB — COMPREHENSIVE METABOLIC PANEL
ALT: 13 [IU]/L (ref 0–44)
AST: 22 [IU]/L (ref 0–40)
Albumin: 3.9 g/dL — ABNORMAL LOW (ref 4.1–5.1)
Alkaline Phosphatase: 104 [IU]/L (ref 44–121)
BUN/Creatinine Ratio: 13 (ref 9–20)
BUN: 11 mg/dL (ref 6–20)
Bilirubin Total: <0.2 mg/dL (ref 0.0–1.2)
CO2: 26 mmol/L (ref 20–29)
Calcium: 9.4 mg/dL (ref 8.7–10.2)
Chloride: 99 mmol/L (ref 96–106)
Creatinine, Ser: 0.84 mg/dL (ref 0.76–1.27)
Globulin, Total: 3.9 g/dL (ref 1.5–4.5)
Glucose: 96 mg/dL (ref 70–99)
Potassium: 4.1 mmol/L (ref 3.5–5.2)
Sodium: 138 mmol/L (ref 134–144)
Total Protein: 7.8 g/dL (ref 6.0–8.5)
eGFR: 118 mL/min/{1.73_m2} (ref >59)

## 2023-01-09 LAB — T4, FREE: Free T4: 1.02 ng/dL (ref 0.82–1.77)

## 2023-01-09 LAB — T3 UPTAKE: T3 Uptake Ratio: 28 % (ref 24–39)

## 2023-01-09 LAB — HGB A1C W/O EAG: Hgb A1c MFr Bld: 5.8 % — ABNORMAL HIGH (ref 4.8–5.6)
